# Patient Record
Sex: Male | Born: 1990 | Race: Black or African American | Hispanic: No | Marital: Single | Smoking: Current every day smoker
Health system: Southern US, Community
[De-identification: ages and names within clinical notes are randomized; demographics above are authoritative.]

## PROBLEM LIST (undated history)

## (undated) DIAGNOSIS — R569 Unspecified convulsions: Secondary | ICD-10-CM

---

## 2016-05-29 ENCOUNTER — Emergency Department
Admission: EM | Admit: 2016-05-29 | Discharge: 2016-05-29 | Disposition: A | Discharge status: Home / Self Care | Payer: Self-pay | Attending: Emergency Medicine | Admitting: Emergency Medicine

## 2016-05-29 DIAGNOSIS — R569 Unspecified convulsions: Secondary | ICD-10-CM

## 2016-05-29 DIAGNOSIS — G40909 Epilepsy, unspecified, not intractable, without status epilepticus: Secondary | ICD-10-CM | POA: Insufficient documentation

## 2016-05-29 DIAGNOSIS — F17210 Nicotine dependence, cigarettes, uncomplicated: Secondary | ICD-10-CM | POA: Insufficient documentation

## 2016-05-29 HISTORY — DX: Unspecified convulsions: R56.9

## 2016-05-29 LAB — BASIC METABOLIC PANEL
Anion gap: 7 (ref 5–15)
BUN: 12 mg/dL (ref 6–20)
CALCIUM: 9.3 mg/dL (ref 8.9–10.3)
CO2: 28 mmol/L (ref 22–32)
CREATININE: 1.15 mg/dL (ref 0.61–1.24)
Chloride: 101 mmol/L (ref 101–111)
GFR calc Af Amer: >60 mL/min (ref >60)
GLUCOSE: 146 mg/dL — AB (ref 65–99)
Potassium: 3.3 mmol/L — ABNORMAL LOW (ref 3.5–5.1)
Sodium: 136 mmol/L (ref 135–145)

## 2016-05-29 LAB — CBC
HEMATOCRIT: 40.1 % (ref 40.0–52.0)
Hemoglobin: 13.4 g/dL (ref 13.0–18.0)
MCH: 29.5 pg (ref 26.0–34.0)
MCHC: 33.5 g/dL (ref 32.0–36.0)
MCV: 88 fL (ref 80.0–100.0)
PLATELETS: 242 10*3/uL (ref 150–440)
RBC: 4.56 MIL/uL (ref 4.40–5.90)
RDW: 13 % (ref 11.5–14.5)
WBC: 7.6 10*3/uL (ref 3.8–10.6)

## 2016-05-29 MED ORDER — DIVALPROEX SODIUM ER 500 MG PO TB24: 1000.0000 mg | ORAL_TABLET | Freq: Every day | ORAL | 1 refills | Status: AC

## 2016-05-29 NOTE — ED Provider Notes (Signed)
[facility] Emergency Department Provider Note   ____________________________________________   I have reviewed the triage vital signs and the nursing notes.   HISTORY  Chief Complaint Seizures   History limited by: Not Limited   HPI Brett Thompson is a 26 y.o. male who presents to the emergency department today because of concerns for possible seizure-like activity. The patient states he was at an ice cream shop when he started to feel very warm. He started feeling lightheaded. The next thing he remembers is being transported to the hospital. Apparently had seizure-like activity. The patient states that he does have a headache. He states that he feels like he normally does after a seizure. The patient states that he does have a history of seizures. He has not been on  medication for at least a year. He denies that a doctor told him to stop. Patient denies any recent fevers or illness. He denies any sleep deprivation. Denies any alcohol or stimulant drug use.   Past Medical History:  Diagnosis Date  . Seizures (HCC)     There are no active problems to display for this patient.   History reviewed. No pertinent surgical history.  Prior to Admission medications   Not on File    Allergies Red dye  No family history on file.  Social History Social History  Substance Use Topics  . Smoking status: Current Every Day Smoker    Packs/day: 0.50    Types: Cigarettes  . Smokeless tobacco: Never Used  . Alcohol use Yes     Comment: social drinker    Review of Systems Constitutional: No fever/chills Eyes: No visual changes. ENT: No sore throat. Cardiovascular: Denies chest pain. Respiratory: Denies shortness of breath. Gastrointestinal: No abdominal pain.  No nausea, no vomiting.  No diarrhea.   Genitourinary: Negative for dysuria. Musculoskeletal: Negative for back pain. Skin: Negative for rash. Neurological: Negative for headaches, focal weakness or  numbness.  ____________________________________________   PHYSICAL EXAM:  VITAL SIGNS: ED Triage Vitals  Enc Vitals Group     BP 05/29/16 1601 98/62     Pulse Rate 05/29/16 1601 (!) 58     Resp 05/29/16 1601 16     Temp 05/29/16 1601 97.4 F (36.3 C)     Temp Source 05/29/16 1601 Oral     SpO2 05/29/16 1555 98 %     Weight 05/29/16 1558 190 lb (86.2 kg)     Height 05/29/16 1558  (1.905 m)     Head Circumference --      Peak Flow --      Pain Score 05/29/16 1557 5     Pain Loc --      Pain Edu? --      Excl. in GC? --      Constitutional: Alert and oriented. Somewhat slow with responses.  Eyes: Conjunctivae are normal. Normal extraocular movements. ENT   Head: Normocephalic and atraumatic.   Nose: No congestion/rhinnorhea.   Mouth/Throat: Mucous membranes are moist.   Neck: No stridor. Hematological/Lymphatic/Immunilogical: No cervical lymphadenopathy. Cardiovascular: Normal rate, regular rhythm.  No murmurs, rubs, or gallops.  Respiratory: Normal respiratory effort without tachypnea nor retractions. Breath sounds are clear and equal bilaterally. No wheezes/rales/rhonchi. Gastrointestinal: Soft and non tender. No rebound. No guarding.  Genitourinary: Deferred Musculoskeletal: Normal range of motion in all extremities. No lower extremity edema. Neurologic:  Somewhat slow with responses. EOMI. PERRL. Strength 5/5 in upper and lower extremities. Sensation grossly intact. Skin:  Skin is warm,  dry and intact. No rash noted. Psychiatric: Mood and affect are normal. Speech and behavior are normal. Patient exhibits appropriate insight and judgment.  ____________________________________________    LABS (pertinent positives/negatives)  Labs Reviewed  BASIC METABOLIC PANEL - Abnormal; Notable for the following:       Result Value   Potassium 3.3 (*)    Glucose, Bld 146 (*)    All other components within normal limits  CBC      ____________________________________________   EKG  I, Phineas Semen, attending physician, personally viewed and interpreted this EKG  EKG Time: 1607 Rate: 59 Rhythm: normal sinus rhythm Axis: normal Intervals: qtc 419 QRS: borderline IVCD ST changes: no st elevation, t wave inversion V1 Impression: abnormal ekg   ____________________________________________    RADIOLOGY  None  ____________________________________________   PROCEDURES  Procedures  ____________________________________________   INITIAL IMPRESSION / ASSESSMENT AND PLAN / ED COURSE  Pertinent labs & imaging results that were available during my care of the patient were reviewed by me and considered in my medical decision making (see chart for details).  Patient presents to the emergency department today after seizure-like episode. Patient was somewhat slow to respond on initial exam however repeat exam does show that the patient was much more mentally alert. Do think likely patient had another seizure. He had been followed for seizures in the past and we will restart patient on a low dose of Depakote. Additionally will give patient a neurology follow-up for medication management. I did discuss with the patient seizure precautions including but not limited to no driving.  ____________________________________________   FINAL CLINICAL IMPRESSION(S) / ED DIAGNOSES  Final diagnoses:  Seizure [facility])     Note: This dictation was prepared with Dragon dictation. Any transcriptional errors that result from this process are unintentional     Phineas Semen, MD 05/29/16 2243

## 2016-05-29 NOTE — ED Notes (Signed)
Pt arrived via ems for c/o seizure activity - pt has history of seizures with last one being 2 years ago - pt has not taken depakote in over 2 years for seizures - pt was not incontinent, no N/V, and is A&O x4

## 2016-05-29 NOTE — ED Triage Notes (Signed)
Pt arrived via ems for c/o seizure activity - pt has history of seizures with last one being 2 years ago - pt has not taken depakote in over 2 years for seizures - pt was not incontinent, no N/V, and is A&O x4 

## 2016-05-29 NOTE — Discharge Instructions (Signed)
Please seek medical attention for any high fevers, chest pain, shortness of breath, change in behavior, persistent vomiting, bloody stool or any other new or concerning symptoms. As we discussed it is very important that you do not drive, go up on roofs, swim, or put yourself in any situation that might be dangerous for you or others if you were to have another seizure until you are cleared by a neurologist.

## 2016-07-17 ENCOUNTER — Emergency Department
Admission: EM | Admit: 2016-07-17 | Discharge: 2016-07-17 | Disposition: A | Discharge status: Home / Self Care | Payer: Self-pay | Attending: Emergency Medicine | Admitting: Emergency Medicine

## 2016-07-17 DIAGNOSIS — K047 Periapical abscess without sinus: Secondary | ICD-10-CM | POA: Insufficient documentation

## 2016-07-17 DIAGNOSIS — F17210 Nicotine dependence, cigarettes, uncomplicated: Secondary | ICD-10-CM | POA: Insufficient documentation

## 2016-07-17 DIAGNOSIS — Z79899 Other long term (current) drug therapy: Secondary | ICD-10-CM | POA: Insufficient documentation

## 2016-07-17 MED ORDER — OXYCODONE HCL 5 MG PO TABS
5.0000 mg | ORAL_TABLET | Freq: Once | ORAL | Status: AC
  Administered 2016-07-17: 5 mg via ORAL
  Filled 2016-07-17: qty 1

## 2016-07-17 MED ORDER — PENICILLIN V POTASSIUM 500 MG PO TABS: 500.0000 mg | ORAL_TABLET | Freq: Three times a day (TID) | ORAL | 0 refills | Status: AC

## 2016-07-17 MED ORDER — OXYCODONE HCL 5 MG PO TABS: 5.0000 mg | ORAL_TABLET | ORAL | 0 refills | Status: AC | PRN

## 2016-07-17 NOTE — ED Triage Notes (Signed)
PT c/o right sided toothache for the past week.

## 2016-07-17 NOTE — ED Notes (Signed)
Patient is complaining of right sided dental pain.

## 2016-07-17 NOTE — Discharge Instructions (Signed)
OPTIONS FOR DENTAL FOLLOW UP CARE ° °[place] [facility] [employer] and Human Services - Local Safety Net Dental Clinics °http://www.ncdhhs.gov/dph/oralhealth/services/safetynetclinics.htm °  °[facility] (336-562-3123) ° °[facility] [place] (919-933-9087) ° °[facility] ([phone] ext [phone]) ° °[facility] ([phone]) ° °[facility] ([phone]) °This clinic caters to the indigent population and is on a lottery system. °Location: °[facility], [address], [place] °Clinic Hours: °Wednesdays from 6pm - 9pm, patients seen by a lottery system. °For dates, call or go to [email or web address]/[facility]/patients/[email or web address]-[facility] °Services: °Cleanings, fillings and simple extractions. °Payment Options: °DENTAL WORK IS FREE OF CHARGE. Bring proof of income or support. °Best way to get seen: °Arrive at 5:15 pm - this is a lottery, NOT first come/first serve, so arriving earlier will not increase your chances of being seen. °  °  °[facility] °[phone] °Select option 1 for emergencies °  °Location: °[facility], [address], [place] °Clinic Hours: °No walk-ins accepted - call the day before to schedule an appointment. °Check in times are 9:30 am and 1:30 pm. °Services: °Simple extractions, temporary fillings, pulpectomy/pulp debridement, uncomplicated abscess drainage. °Payment Options: °PAYMENT IS DUE AT THE TIME OF SERVICE.  Fee is usually $100-200, additional surgical procedures (e.g. abscess drainage) may be extra. °Cash, checks, Visa/MasterCard accepted.  Can file Medicaid if patient is covered for dental - patient should call case worker to check. °No discount for [facility] patients. °Best way to get seen: °MUST call the day before and get onto the schedule. Can usually be seen the next 1-2 days. No walk-ins accepted. °  °  °[facility] °[phone] °   °Location: °[facility], [address], [place] °Clinic Hours: °M, W, Th, F 8am or 1:30pm, Tues 9a or 1:30 - first come/first served. °Services: °Simple extractions, temporary fillings, uncomplicated abscess drainage.  You do not need to be an [place] resident. °Payment Options: °PAYMENT IS DUE AT THE TIME OF SERVICE. °Dental insurance, otherwise sliding scale - bring proof of income or support. °Depending on income and treatment needed, cost is usually $50-200. °Best way to get seen: °Arrive early as it is first come/first served. °  °  °[facility] °[phone] °  °Location: °[address] °Clinic Hours: °Mon-Thu 8a-5p °Services: °Most basic dental services including extractions and fillings. °Payment Options: °PAYMENT IS DUE AT THE TIME OF SERVICE. °Sliding scale, up to 50% off - bring proof if income or support. °Medicaid with dental option accepted. °Best way to get seen: °Call to schedule an appointment, can usually be seen within 2 weeks OR they will try to see walk-ins - show up at 8a or 2p (you may have to wait). °  °  °[facility] °[phone] °[place] RESIDENTS ONLY °  °Location: °[facility], [address], [place], [place] [address] °Clinic Hours: By appointment only. °Monday - Thursday 8am-5pm, Friday 8am-12pm °Services: Cleanings, fillings, extractions. °Payment Options: °PAYMENT IS DUE AT THE TIME OF SERVICE. °Cash, Visa or MasterCard. Sliding scale - $30 minimum per service. °Best way to get seen: °Come in to office, complete packet and make an appointment - need proof of income °or support monies for each household member and proof of [place] residence. °Usually takes about a month to get in. °  °  °[facility] °[phone] °  °Location: °[address]   [place] °Clinic Hours: Walk-in Urgent Care Dental Services are offered Monday-Friday  mornings only. °The numbers of emergencies accepted daily is limited to the number of °providers available. °Maximum 15 - Mondays, Wednesdays & Thursdays °Maximum 10 - Tuesdays & Fridays °Services: °You do not need to be a [place] resident to be seen for a dental emergency. °Emergencies are defined as pain, swelling, abnormal bleeding, or dental trauma. Walkins will receive x-rays if needed. °NOTE: Dental cleaning is not an emergency. °Payment Options: °PAYMENT IS DUE AT THE TIME OF SERVICE. °Minimum co-pay is $40.00 for uninsured patients. °Minimum co-pay is $3.00 for Medicaid with dental coverage. °Dental Insurance is accepted and must be presented at time of visit. °Medicare does not cover dental. °Forms of payment: Cash, credit card, checks. °Best way to get seen: °If not previously registered with the clinic, walk-in dental registration begins at 7:15 am and is on a first come/first serve basis. °If previously registered with the clinic, call to make an appointment. °  °  °[facility] °[phone] °[place] RESIDENTS ONLY °  °Location: °[address], [place] °Clinic Hours: °Mon-Thu 10a-2p °Services: Extractions only! °Payment Options: °FREE (donations accepted) - bring proof of income or support °Best way to get seen: °Call and schedule an appointment OR come at 8am on the 1st Monday of every month (except for holidays) when it is first come/first served. °  °  °[facility] °[phone] °  °Location: °[address], [place] °Clinic Hours: °Friday mornings °Services, Payment Options, Best way to get seen: °Call for info °

## 2016-07-17 NOTE — ED Provider Notes (Signed)
[facility] Emergency Department Provider Note  ____________________________________________  Time seen: Approximately 7:10 PM  I have reviewed the triage vital signs and the nursing notes.   HISTORY  Chief Complaint Dental Pain    HPI Brett Thompson is a 26 y.o. male that presents to the emergency department with upper and lower right-sided dental pain for one week. Patient rates from the right side of his mouth to his ear. He characterizes the pain as the pain as throbbing. He had chills last night. Patient states that he has taken a significant amount of Tylenol this week. He last saw the dentist one year ago. He knows he needs to have his molars removed but was "800 hours for the procedure. He denies drainage from mouth, difficulty opening and closing mouth, shortness of breath, chest pain, nausea, vomiting, abdominal pain.   Past Medical History:  Diagnosis Date  . Seizures (HCC)     There are no active problems to display for this patient.   History reviewed. No pertinent surgical history.  Prior to Admission medications   Medication Sig Start Date End Date Taking? Authorizing Provider  divalproex (DEPAKOTE ER) 500 MG 24 hr tablet Take 2 tablets (1,000 mg total) by mouth daily. 05/29/16 05/29/17  Phineas SemenGoodman, Graydon, MD  oxyCODONE (ROXICODONE) 5 MG immediate release tablet Take 1 tablet (5 mg total) by mouth every 4 (four) hours as needed for severe pain. 07/17/16   Enid DerryWagner, Demontre Padin, PA-C  penicillin v potassium (VEETID) 500 MG tablet Take 1 tablet (500 mg total) by mouth 3 (three) times daily. 07/17/16 07/27/16  Enid DerryWagner, Beecher Furio, PA-C    Allergies Red dye  No family history on file.  Social History Social History  Substance Use Topics  . Smoking status: Current Every Day Smoker    Packs/day: 0.50    Types: Cigarettes  . Smokeless tobacco: Never Used  . Alcohol use Yes     Comment: social drinker     Review of Systems  Cardiovascular: No chest  pain. Respiratory:  No SOB. Gastrointestinal: No abdominal pain.  No nausea, no vomiting.  Musculoskeletal: Negative for musculoskeletal pain. Skin: Negative for rash, abrasions, lacerations, ecchymosis. Neurological: Negative for headaches, numbness or tingling   ____________________________________________   PHYSICAL EXAM:  VITAL SIGNS: ED Triage Vitals  Enc Vitals Group     BP 07/17/16 1815 (!) 175/100     Pulse Rate 07/17/16 1814 (!) 57     Resp 07/17/16 1814 16     Temp 07/17/16 1814 98.9 F (37.2 C)     Temp Source 07/17/16 1814 Oral     SpO2 07/17/16 1814 100 %     Weight 07/17/16 1814 175 lb (79.4 kg)     Height 07/17/16 1814 6\' 3"  (1.905 m)     Head Circumference --      Peak Flow --      Pain Score 07/17/16 1813 9     Pain Loc --      Pain Edu? --      Excl. in GC? --      Constitutional: Alert and oriented. Well appearing and in no acute distress. Eyes: Conjunctivae are normal. PERRL. EOMI. Head: Atraumatic. ENT:      Ears:      Nose: No congestion/rhinnorhea.      Mouth/Throat: Mucous membranes are moist. Large hole with significant decay in upper right molar. Decay present in the bottom right molar. Tenderness to palpation around upper and lower right molars. No TMJ pain.  No drainage. No visible swelling. Neck: No stridor.   Cardiovascular: Normal rate, regular rhythm.  Good peripheral circulation. Respiratory: Normal respiratory effort without tachypnea or retractions. Lungs CTAB. Good air entry to the bases with no decreased or absent breath sounds. Musculoskeletal: Full range of motion to all extremities. No gross deformities appreciated. Neurologic:  Normal speech and language. No gross focal neurologic deficits are appreciated.  Skin:  Skin is warm, dry and intact. No rash noted.   ____________________________________________   LABS (all labs ordered are listed, but only abnormal results are displayed)  Labs Reviewed - No data to  display ____________________________________________  EKG   ____________________________________________  RADIOLOGY  No results found.  ____________________________________________    PROCEDURES  Procedure(s) performed:    Procedures    Medications  oxyCODONE (Oxy IR/ROXICODONE) immediate release tablet 5 mg (5 mg Oral Given 07/17/16 1900)     ____________________________________________   INITIAL IMPRESSION / ASSESSMENT AND PLAN / ED COURSE  Pertinent labs & imaging results that were available during my care of the patient were reviewed by me and considered in my medical decision making (see chart for details).  Review of the [place] CSRS was performed in accordance of the [facility] prior to dispensing any controlled drugs.     Patient's diagnosis is consistent with dental abscess. Vital signs and exam are reassuring. Education was provided. Patient is allergic to red dye and cannot take clindamycin or amoxicillin. Patient will be discharged home with prescriptions for a couple of doses of oxycodone and penicillin. Patient is to follow up with a dentist as directed. Patient is given ED precautions to return to the ED for any worsening or new symptoms.     ____________________________________________  FINAL CLINICAL IMPRESSION(S) / ED DIAGNOSES  Final diagnoses:  Dental abscess      NEW MEDICATIONS STARTED DURING THIS VISIT:  Discharge Medication List as of 07/17/2016  7:33 PM    START taking these medications   Details  oxyCODONE (ROXICODONE) 5 MG immediate release tablet Take 1 tablet (5 mg total) by mouth every 4 (four) hours as needed for severe pain., Starting Tue 07/17/2016, Print    penicillin v potassium (VEETID) 500 MG tablet Take 1 tablet (500 mg total) by mouth 3 (three) times daily., Starting Tue 07/17/2016, Until Fri 07/27/2016, Print            This chart was dictated using voice recognition software/Dragon. Despite best efforts to proofread,  errors can occur which can change the meaning. Any change was purely unintentional.    Enid Derry, PA-C 07/17/16 2022    Pershing Proud Myra Rude, MD 07/17/16 [phone]

## 2018-02-16 ENCOUNTER — Other Ambulatory Visit: Payer: Self-pay

## 2018-02-16 ENCOUNTER — Emergency Department
Admission: EM | Admit: 2018-02-16 | Discharge: 2018-02-16 | Disposition: A | Discharge status: Home / Self Care | Payer: Self-pay | Attending: Emergency Medicine | Admitting: Emergency Medicine

## 2018-02-16 ENCOUNTER — Emergency Department: Payer: Self-pay

## 2018-02-16 DIAGNOSIS — R05 Cough: Secondary | ICD-10-CM | POA: Insufficient documentation

## 2018-02-16 DIAGNOSIS — R059 Cough, unspecified: Secondary | ICD-10-CM

## 2018-02-16 DIAGNOSIS — F17210 Nicotine dependence, cigarettes, uncomplicated: Secondary | ICD-10-CM | POA: Insufficient documentation

## 2018-02-16 DIAGNOSIS — Z77120 Contact with and (suspected) exposure to mold (toxic): Secondary | ICD-10-CM | POA: Insufficient documentation

## 2018-02-16 LAB — INFLUENZA PANEL BY PCR (TYPE A & B)
INFLBPCR: NEGATIVE
Influenza A By PCR: NEGATIVE

## 2018-02-16 MED ORDER — ALBUTEROL SULFATE HFA 108 (90 BASE) MCG/ACT IN AERS
2.0000 | INHALATION_SPRAY | Freq: Four times a day (QID) | RESPIRATORY_TRACT | 0 refills | Status: AC | PRN
Start: 2018-02-16 — End: ?

## 2018-02-16 MED ORDER — IPRATROPIUM-ALBUTEROL 0.5-2.5 (3) MG/3ML IN SOLN
3.0000 mL | Freq: Once | RESPIRATORY_TRACT | Status: AC
  Administered 2018-02-16: 3 mL via RESPIRATORY_TRACT
  Filled 2018-02-16: qty 3

## 2018-02-16 MED ORDER — CETIRIZINE HCL 10 MG PO CAPS: 10.0000 mg | ORAL_CAPSULE | Freq: Every day | ORAL | 0 refills | Status: AC

## 2018-02-16 NOTE — ED Notes (Signed)
esignature pad not working in room. Pt verbalized understanding of discharge and follow up instructions 

## 2018-02-16 NOTE — Discharge Instructions (Addendum)
Your flu test is negative.  Your chest x-ray is negative for pneumonia.  Please call the health department to discuss options for mold exposure exposure.  Please begin allergy medication and use inhaler as needed.

## 2018-02-16 NOTE — ED Provider Notes (Signed)
[facility] Emergency Department Provider Note  ____________________________________________  Time seen: Approximately 11:51 AM  I have reviewed the triage vital signs and the nursing notes.   HISTORY  Chief Complaint Allergic Reaction    HPI Brett Thompson is a 28 y.o. male that presents emergency department for evaluation of nonproductive cough on and off for 1 month. Patient has occasional shortness of breath. Patient states that family just found out that there is mold in their house.  They have talked to the landlord, who they say will not do anything about the mold.  He here with his family that have similar symptoms.  He smokes a half a pack of cigarettes per day.  No asthma or allergies.  No fever, chest pain, nausea, vomiting, abdominal pain.   Past Medical History:  Diagnosis Date  . Seizures (HCC)     There are no active problems to display for this patient.   History reviewed. No pertinent surgical history.  Prior to Admission medications   Medication Sig Start Date End Date Taking? Authorizing Provider  albuterol (PROVENTIL HFA;VENTOLIN HFA) 108 (90 Base) MCG/ACT inhaler Inhale 2 puffs into the lungs every 6 (six) hours as needed for wheezing or shortness of breath. 02/16/18   Enid Derry, PA-C  Cetirizine HCl (ZYRTEC ALLERGY) 10 MG CAPS Take 1 capsule (10 mg total) by mouth daily. 02/16/18   Enid Derry, PA-C  divalproex (DEPAKOTE ER) 500 MG 24 hr tablet Take 2 tablets (1,000 mg total) by mouth daily. 05/29/16 05/29/17  Phineas Semen, MD  oxyCODONE (ROXICODONE) 5 MG immediate release tablet Take 1 tablet (5 mg total) by mouth every 4 (four) hours as needed for severe pain. 07/17/16   Enid Derry, PA-C    Allergies Red dye  History reviewed. No pertinent family history.  Social History Social History   Tobacco Use  . Smoking status: Current Every Day Smoker    Packs/day: 0.50    Types: Cigarettes  . Smokeless tobacco: Never Used   Substance Use Topics  . Alcohol use: Yes    Comment: social drinker  . Drug use: No     Review of Systems  Constitutional: No fever/chills Eyes: No visual changes. No discharge. ENT: Positive for congestion and rhinorrhea. Cardiovascular: No chest pain. Respiratory: Positive for cough and SOB. Gastrointestinal: No abdominal pain.  No nausea, no vomiting.   Musculoskeletal: Negative for musculoskeletal pain. Skin: Negative for rash, abrasions, lacerations, ecchymosis. Neurological: Negative for headaches.   ____________________________________________   PHYSICAL EXAM:  VITAL SIGNS: ED Triage Vitals [02/16/18 1102]  Enc Vitals Group     BP (!) 157/82     Pulse Rate 69     Resp 16     Temp 98.3 F (36.8 C)     Temp Source Oral     SpO2 99 %     Weight 175 lb (79.4 kg)     Height 6\' 3"  (1.905 m)     Head Circumference      Peak Flow      Pain Score 5     Pain Loc      Pain Edu?      Excl. in GC?      Constitutional: Alert and oriented. Well appearing and in no acute distress. Eyes: Conjunctivae are normal. PERRL. EOMI. No discharge. Head: Atraumatic. ENT: No frontal and maxillary sinus tenderness.      Ears: Tympanic membranes pearly gray with good landmarks. No discharge.      Nose: No  congestion/rhinnorhea.      Mouth/Throat: Mucous membranes are moist. Oropharynx non-erythematous.  Neck: No stridor.   Hematological/Lymphatic/Immunilogical: No cervical lymphadenopathy. Cardiovascular: Normal rate, regular rhythm.  Good peripheral circulation. Respiratory: Normal respiratory effort without tachypnea or retractions. Lungs CTAB. Good air entry to the bases with no decreased or absent breath sounds. Gastrointestinal: Bowel sounds 4 quadrants. Soft and nontender to palpation. No guarding or rigidity. No palpable masses. No distention. Musculoskeletal: Full range of motion to all extremities. No gross deformities appreciated. Neurologic:  Normal speech and  language. No gross focal neurologic deficits are appreciated.  Skin:  Skin is warm, dry and intact. No rash noted. Psychiatric: Mood and affect are normal. Speech and behavior are normal. Patient exhibits appropriate insight and judgement.   ____________________________________________   LABS (all labs ordered are listed, but only abnormal results are displayed)  Labs Reviewed  INFLUENZA PANEL BY PCR (TYPE A & B)   ____________________________________________  EKG   ____________________________________________  RADIOLOGY Lexine Baton, personally viewed and evaluated these images (plain radiographs) as part of my medical decision making, as well as reviewing the written report by the radiologist.  Dg Chest 2 View  Result Date: 02/16/2018 CLINICAL DATA:  Cough and shortness of breath EXAM: CHEST - 2 VIEW COMPARISON:  None. FINDINGS: Lungs are clear. Heart size and pulmonary vascularity are normal. No adenopathy. No bone lesions. IMPRESSION: No edema or consolidation. Electronically Signed   By: Bretta Bang III M.D.   On: 02/16/2018 12:28    ____________________________________________    PROCEDURES  Procedure(s) performed:    Procedures    Medications  ipratropium-albuterol (DUONEB) 0.5-2.5 (3) MG/3ML nebulizer solution 3 mL (3 mLs Nebulization Given 02/16/18 1213)     ____________________________________________   INITIAL IMPRESSION / ASSESSMENT AND PLAN / ED COURSE  Pertinent labs & imaging results that were available during my care of the patient were reviewed by me and considered in my medical decision making (see chart for details).  Review of the [place] CSRS was performed in accordance of the [facility] prior to dispensing any controlled drugs.     Patient presented the emergency department with family for concerns of exposure to mold. Vital signs and exam are reassuring.  Chest x-ray negative for acute cardiopulmonary processes.  Influenza test is  negative.  Patient felt much better after DuoNeb.   Patient feels comfortable going home. Patient will be discharged home with prescriptions for albuterol inhaler and Zyrtec. Patient is to follow up with primary care and health department as needed or otherwise directed. Patient is given ED precautions to return to the ED for any worsening or new symptoms.     ____________________________________________  FINAL CLINICAL IMPRESSION(S) / ED DIAGNOSES  Final diagnoses:  Mold exposure  Cough      NEW MEDICATIONS STARTED DURING THIS VISIT:  ED Discharge Orders         Ordered    albuterol (PROVENTIL HFA;VENTOLIN HFA) 108 (90 Base) MCG/ACT inhaler  Every 6 hours PRN     02/16/18 1356    Cetirizine HCl (ZYRTEC ALLERGY) 10 MG CAPS  Daily     02/16/18 1356              This chart was dictated using voice recognition software/Dragon. Despite best efforts to proofread, errors can occur which can change the meaning. Any change was purely unintentional.    Enid Derry, PA-C 02/16/18 1532    Sharyn Creamer, MD 02/16/18 [phone]

## 2018-02-16 NOTE — ED Triage Notes (Signed)
Here with family. Exposed to mold. A&O, no distress noted. Ambulatory.

## 2018-04-27 ENCOUNTER — Other Ambulatory Visit: Payer: Self-pay

## 2018-04-27 ENCOUNTER — Emergency Department
Admission: EM | Admit: 2018-04-27 | Discharge: 2018-04-27 | Disposition: A | Discharge status: Home / Self Care | Payer: Self-pay | Attending: Emergency Medicine | Admitting: Emergency Medicine

## 2018-04-27 ENCOUNTER — Encounter: Payer: Self-pay | Admitting: Emergency Medicine

## 2018-04-27 DIAGNOSIS — Z79899 Other long term (current) drug therapy: Secondary | ICD-10-CM | POA: Insufficient documentation

## 2018-04-27 DIAGNOSIS — B9789 Other viral agents as the cause of diseases classified elsewhere: Secondary | ICD-10-CM | POA: Insufficient documentation

## 2018-04-27 DIAGNOSIS — J069 Acute upper respiratory infection, unspecified: Secondary | ICD-10-CM | POA: Insufficient documentation

## 2018-04-27 DIAGNOSIS — F17210 Nicotine dependence, cigarettes, uncomplicated: Secondary | ICD-10-CM | POA: Insufficient documentation

## 2018-04-27 NOTE — ED Triage Notes (Signed)
Pt to ER with c/o headaches, runny nose and feeling "sluggish" for last several days.

## 2018-04-27 NOTE — ED Provider Notes (Signed)
[facility] Emergency Department Provider Note   ____________________________________________    I have reviewed the triage vital signs and the nursing notes.   HISTORY  Chief Complaint URI     HPI Brett Thompson is a 28 y.o. male who presents with complaints of mild cough, fatigue x1 to 2 days.  He reports his whole family has similar complaints, he was here with his son who is also being evaluated.  He denies shortness of breath.  No recent travel.  No exposure to anyone with a novel coronavirus that he knows of.  Denies shortness of breath.  Past Medical History:  Diagnosis Date  . Seizures (HCC)     There are no active problems to display for this patient.   History reviewed. No pertinent surgical history.  Prior to Admission medications   Medication Sig Start Date End Date Taking? Authorizing Provider  albuterol (PROVENTIL HFA;VENTOLIN HFA) 108 (90 Base) MCG/ACT inhaler Inhale 2 puffs into the lungs every 6 (six) hours as needed for wheezing or shortness of breath. 02/16/18   Enid Derry, PA-C  Cetirizine HCl (ZYRTEC ALLERGY) 10 MG CAPS Take 1 capsule (10 mg total) by mouth daily. 02/16/18   Enid Derry, PA-C  divalproex (DEPAKOTE ER) 500 MG 24 hr tablet Take 2 tablets (1,000 mg total) by mouth daily. 05/29/16 05/29/17  Phineas Semen, MD  oxyCODONE (ROXICODONE) 5 MG immediate release tablet Take 1 tablet (5 mg total) by mouth every 4 (four) hours as needed for severe pain. 07/17/16   Enid Derry, PA-C     Allergies Ibuprofen and Red dye  History reviewed. No pertinent family history.  Social History Social History   Tobacco Use  . Smoking status: Current Every Day Smoker    Packs/day: 0.50    Types: Cigarettes  . Smokeless tobacco: Never Used  Substance Use Topics  . Alcohol use: Yes    Comment: social drinker  . Drug use: No    Review of Systems  Constitutional: No fever/chills, positive fatigue  ENT: Minimal sore  throat  Respiratory: No shortness of breath Gastrointestinal: No abdominal pain.    Musculoskeletal: No myalgias Skin: Negative for rash. Neurological: Negative for headaches     ____________________________________________   PHYSICAL EXAM:  VITAL SIGNS: ED Triage Vitals  Enc Vitals Group     BP 04/27/18 2214 133/83     Pulse Rate 04/27/18 2214 82     Resp 04/27/18 2214 18     Temp 04/27/18 2214 99.1 F (37.3 C)     Temp src --      SpO2 04/27/18 2214 99 %     Weight 04/27/18 2217 77.1 kg (170 lb)     Height 04/27/18 2217 1.905 m (6\' 3" )     Head Circumference --      Peak Flow --      Pain Score 04/27/18 2216 7     Pain Loc --      Pain Edu? --      Excl. in GC? --      Constitutional: Alert and oriented. No acute distress. Pleasant and interactive c. Nose: No congestion/rhinnorhea. Mouth/Throat: Mucous membranes are moist.   Cardiovascular: Normal rate, regular rhythm.  Respiratory: Normal respiratory effort.  No retractions.  Clear to auscultation bilaterally  Musculoskeletal: No lower extremity tenderness nor edema.   Neurologic:  Normal speech and language. No gross focal neurologic deficits are appreciated.   Skin:  Skin is warm, dry and intact. No rash  noted.   ____________________________________________   LABS (all labs ordered are listed, but only abnormal results are displayed)  Labs Reviewed - No data to display ____________________________________________  EKG   ____________________________________________  RADIOLOGY  None ____________________________________________   PROCEDURES  Procedure(s) performed: No  Procedures   Critical Care performed: No ____________________________________________   INITIAL IMPRESSION / ASSESSMENT AND PLAN / ED COURSE  Pertinent labs & imaging results that were available during my care of the patient were reviewed by me and considered in my medical decision making (see chart for details).   Patient well-appearing and in no acute distress.  Exam is quite reassuring, vital signs unremarkable.  Symptoms are consistent with viral upper respiratory infection, recommend supportive care, outpatient follow-up as needed.  Return precautions discussed  Brett Thompson was evaluated in Emergency Department on 04/27/2018 for the symptoms described in the history of present illness. He was evaluated in the context of the global COVID-19 pandemic, which necessitated consideration that the patient might be at risk for infection with the SARS-CoV-2 virus that causes COVID-19. Institutional protocols and algorithms that pertain to the evaluation of patients at risk for COVID-19 are in a state of rapid change based on information released by regulatory bodies including the CDC and federal and state organizations. These policies and algorithms were followed during the patient's care in the ED.    ____________________________________________   FINAL CLINICAL IMPRESSION(S) / ED DIAGNOSES  Final diagnoses:  Viral URI with cough      NEW MEDICATIONS STARTED DURING THIS VISIT:  New Prescriptions   No medications on file     Note:  This document was prepared using Dragon voice recognition software and may include unintentional dictation errors.   Jene Every, MD 04/27/18 [phone]

## 2018-06-28 ENCOUNTER — Other Ambulatory Visit: Payer: Self-pay

## 2018-06-28 ENCOUNTER — Emergency Department: Payer: Self-pay

## 2018-06-28 ENCOUNTER — Encounter: Payer: Self-pay | Admitting: Emergency Medicine

## 2018-06-28 DIAGNOSIS — Y939 Activity, unspecified: Secondary | ICD-10-CM | POA: Insufficient documentation

## 2018-06-28 DIAGNOSIS — Z5321 Procedure and treatment not carried out due to patient leaving prior to being seen by health care provider: Secondary | ICD-10-CM | POA: Insufficient documentation

## 2018-06-28 DIAGNOSIS — Y999 Unspecified external cause status: Secondary | ICD-10-CM | POA: Insufficient documentation

## 2018-06-28 DIAGNOSIS — S6992XA Unspecified injury of left wrist, hand and finger(s), initial encounter: Secondary | ICD-10-CM | POA: Insufficient documentation

## 2018-06-28 DIAGNOSIS — Y929 Unspecified place or not applicable: Secondary | ICD-10-CM | POA: Insufficient documentation

## 2018-06-28 DIAGNOSIS — W231XXA Caught, crushed, jammed, or pinched between stationary objects, initial encounter: Secondary | ICD-10-CM | POA: Insufficient documentation

## 2018-06-28 NOTE — ED Triage Notes (Signed)
Pt hit his left index finger with a socket wrench about 30 mins ago. Pain and swelling to the area. CMS intact.

## 2018-06-29 ENCOUNTER — Emergency Department
Admission: EM | Admit: 2018-06-29 | Discharge: 2018-06-29 | Disposition: A | Discharge status: Home / Self Care | Payer: Self-pay | Attending: Emergency Medicine | Admitting: Emergency Medicine

## 2018-06-29 ENCOUNTER — Encounter: Payer: Self-pay | Admitting: Emergency Medicine

## 2018-06-29 ENCOUNTER — Other Ambulatory Visit: Payer: Self-pay

## 2018-06-29 DIAGNOSIS — Y9389 Activity, other specified: Secondary | ICD-10-CM | POA: Insufficient documentation

## 2018-06-29 DIAGNOSIS — Y929 Unspecified place or not applicable: Secondary | ICD-10-CM | POA: Insufficient documentation

## 2018-06-29 DIAGNOSIS — Z79899 Other long term (current) drug therapy: Secondary | ICD-10-CM | POA: Insufficient documentation

## 2018-06-29 DIAGNOSIS — Y998 Other external cause status: Secondary | ICD-10-CM | POA: Insufficient documentation

## 2018-06-29 DIAGNOSIS — F17210 Nicotine dependence, cigarettes, uncomplicated: Secondary | ICD-10-CM | POA: Insufficient documentation

## 2018-06-29 DIAGNOSIS — W228XXA Striking against or struck by other objects, initial encounter: Secondary | ICD-10-CM | POA: Insufficient documentation

## 2018-06-29 DIAGNOSIS — S60222A Contusion of left hand, initial encounter: Secondary | ICD-10-CM | POA: Insufficient documentation

## 2018-06-29 NOTE — ED Notes (Signed)
Pt with c/o of left 1st finger pain. Pt has limited movement of finger. No swelling and skin is intact.

## 2018-06-29 NOTE — ED Notes (Signed)
Pt verbalized understanding of discharge instructions. NAD at this time. 

## 2018-06-29 NOTE — ED Triage Notes (Signed)
Patient presents to the ED with left forefinger pain since yesterday when he hit his finger with a socket wrench.  Patient was seen in triage yesterday and xrays were performed but patient left prior to provider evaluation.  Patient is in no obvious distress at this time.

## 2018-06-29 NOTE — ED Provider Notes (Signed)
[facility] Emergency Department Provider Note  ____________________________________________   First MD Initiated Contact with Patient 06/29/18 1320     (approximate)  I have reviewed the triage vital signs and the nursing notes.   HISTORY  Chief Complaint Hand Pain    HPI Brett Thompson is a 28 y.o. male presents emergency department complaining of left index finger pain.  He states he hit it with a socket wrench.  He was seen here yesterday and a x-ray was ordered but he left prior to getting results.    Past Medical History:  Diagnosis Date  . Seizures (HCC)     There are no active problems to display for this patient.   History reviewed. No pertinent surgical history.  Prior to Admission medications   Medication Sig Start Date End Date Taking? Authorizing Provider  albuterol (PROVENTIL HFA;VENTOLIN HFA) 108 (90 Base) MCG/ACT inhaler Inhale 2 puffs into the lungs every 6 (six) hours as needed for wheezing or shortness of breath. 02/16/18   Enid Derry, PA-C  Cetirizine HCl (ZYRTEC ALLERGY) 10 MG CAPS Take 1 capsule (10 mg total) by mouth daily. 02/16/18   Enid Derry, PA-C  divalproex (DEPAKOTE ER) 500 MG 24 hr tablet Take 2 tablets (1,000 mg total) by mouth daily. 05/29/16 05/29/17  Phineas Semen, MD  oxyCODONE (ROXICODONE) 5 MG immediate release tablet Take 1 tablet (5 mg total) by mouth every 4 (four) hours as needed for severe pain. 07/17/16   Enid Derry, PA-C    Allergies Ibuprofen and Red dye  No family history on file.  Social History Social History   Tobacco Use  . Smoking status: Current Every Day Smoker    Packs/day: 0.50    Types: Cigarettes  . Smokeless tobacco: Never Used  Substance Use Topics  . Alcohol use: Yes    Comment: social drinker  . Drug use: No    Review of Systems  Constitutional: No fever/chills Eyes: No visual changes. ENT: No sore throat. Respiratory: Denies cough Genitourinary: Negative for  dysuria. Musculoskeletal: Negative for back pain.  Left index finger pain Skin: Negative for rash.    ____________________________________________   PHYSICAL EXAM:  VITAL SIGNS: ED Triage Vitals  Enc Vitals Group     BP 06/29/18 1245 (!) 153/93     Pulse Rate 06/29/18 1245 (!) 56     Resp 06/29/18 1245 18     Temp 06/29/18 1245 98.3 F (36.8 C)     Temp Source 06/29/18 1245 Oral     SpO2 06/29/18 1245 100 %     Weight 06/29/18 1236 175 lb (79.4 kg)     Height 06/29/18 1236 6\' 2"  (1.88 m)     Head Circumference --      Peak Flow --      Pain Score 06/29/18 1236 8     Pain Loc --      Pain Edu? --      Excl. in GC? --     Constitutional: Alert and oriented. Well appearing and in no acute distress. Eyes: Conjunctivae are normal.  Head: Atraumatic. Nose: No congestion/rhinnorhea. Mouth/Throat: Mucous membranes are moist.   Neck:  supple no lymphadenopathy noted Cardiovascular: Normal rate, regular rhythm. Respiratory: Normal respiratory effort.  No retractions GU: deferred Musculoskeletal: FROM all extremities, warm and well perfused, left index finger is tender at the MIP, no swelling is noted, neurovascular is intact Neurologic:  Normal speech and language.  Skin:  Skin is warm, dry and intact. No  rash noted. Psychiatric: Mood and affect are normal. Speech and behavior are normal.  ____________________________________________   LABS (all labs ordered are listed, but only abnormal results are displayed)  Labs Reviewed - No data to display ____________________________________________   ____________________________________________  RADIOLOGY  X-ray from yesterday was reviewed with no fracture noted in the left hand  ____________________________________________   PROCEDURES  Procedure(s) performed: Fingers were buddy taped  Procedures    ____________________________________________   INITIAL IMPRESSION / ASSESSMENT AND PLAN / ED COURSE  Pertinent  labs & imaging results that were available during my care of the patient were reviewed by me and considered in my medical decision making (see chart for details).   Patient is 28 year old male presents emergency department complaining of left index finger pain.  Had x-ray yesterday but left prior to receiving results.  Physical exam left index finger is tender at the MIP.  Minimal swelling is noted.  Neurovascular is intact  X-ray is negative  Fingers were buddy taped  Explained all the findings to the patient.  His x-ray is negative.  He is to follow-up with orthopedics if not better in 5 to 7 days.  States he understands will comply.  Is discharged stable condition.     As part of my medical decision making, I reviewed the following data within the electronic MEDICAL RECORD NUMBER Nursing notes reviewed and incorporated, Old chart reviewed, Radiograph reviewed from 06/28/2018, and is negative for fracture, Notes from prior ED visits and  Controlled Substance Database  ____________________________________________   FINAL CLINICAL IMPRESSION(S) / ED DIAGNOSES  Final diagnoses:  Contusion of left hand, initial encounter      NEW MEDICATIONS STARTED DURING THIS VISIT:  Discharge Medication List as of 06/29/2018  1:47 PM       Note:  This document was prepared using Dragon voice recognition software and may include unintentional dictation errors.    Faythe GheeFisher, Susan W, PA-C 06/29/18 1545    Arnaldo NatalMalinda, Paul F, MD 06/29/18 [phone]

## 2018-06-29 NOTE — Discharge Instructions (Addendum)
Follow-up with your regular doctor or Dr. Rosita Kea if not better in 5 to 7 days.  Keep the fingers buddy taped to give him extra support and calm down the inflammation.  Take Tylenol and ibuprofen for pain as needed.  Apply ice and keep the hand elevated to decrease pain.  Return if worsening.

## 2018-11-28 ENCOUNTER — Emergency Department
Admission: EM | Admit: 2018-11-28 | Discharge: 2018-11-28 | Disposition: A | Discharge status: Home / Self Care | Payer: Self-pay | Attending: Emergency Medicine | Admitting: Emergency Medicine

## 2018-11-28 ENCOUNTER — Other Ambulatory Visit: Payer: Self-pay

## 2018-11-28 DIAGNOSIS — B349 Viral infection, unspecified: Secondary | ICD-10-CM | POA: Insufficient documentation

## 2018-11-28 DIAGNOSIS — F17210 Nicotine dependence, cigarettes, uncomplicated: Secondary | ICD-10-CM | POA: Insufficient documentation

## 2018-11-28 DIAGNOSIS — Z20828 Contact with and (suspected) exposure to other viral communicable diseases: Secondary | ICD-10-CM | POA: Insufficient documentation

## 2018-11-28 DIAGNOSIS — Z79899 Other long term (current) drug therapy: Secondary | ICD-10-CM | POA: Insufficient documentation

## 2018-11-28 LAB — CBC
HCT: 35.6 % — ABNORMAL LOW (ref 39.0–52.0)
Hemoglobin: 12.1 g/dL — ABNORMAL LOW (ref 13.0–17.0)
MCH: 29.5 pg (ref 26.0–34.0)
MCHC: 34 g/dL (ref 30.0–36.0)
MCV: 86.8 fL (ref 80.0–100.0)
Platelets: 216 10*3/uL (ref 150–400)
RBC: 4.1 MIL/uL — ABNORMAL LOW (ref 4.22–5.81)
RDW: 12.1 % (ref 11.5–15.5)
WBC: 8.8 10*3/uL (ref 4.0–10.5)
nRBC: 0 % (ref 0.0–0.2)

## 2018-11-28 LAB — COMPREHENSIVE METABOLIC PANEL
ALT: 34 U/L (ref 0–44)
AST: 30 U/L (ref 15–41)
Albumin: 4.1 g/dL (ref 3.5–5.0)
Alkaline Phosphatase: 60 U/L (ref 38–126)
Anion gap: 10 (ref 5–15)
BUN: 11 mg/dL (ref 6–20)
CO2: 27 mmol/L (ref 22–32)
Calcium: 9.1 mg/dL (ref 8.9–10.3)
Chloride: 105 mmol/L (ref 98–111)
Creatinine, Ser: 0.82 mg/dL (ref 0.61–1.24)
GFR calc Af Amer: >60 mL/min (ref >60)
GFR calc non Af Amer: >60 mL/min (ref >60)
Glucose, Bld: 102 mg/dL — ABNORMAL HIGH (ref 70–99)
Potassium: 3.8 mmol/L (ref 3.5–5.1)
Sodium: 142 mmol/L (ref 135–145)
Total Bilirubin: 0.5 mg/dL (ref 0.3–1.2)
Total Protein: 6.6 g/dL (ref 6.5–8.1)

## 2018-11-28 MED ORDER — METHYLPREDNISOLONE SODIUM SUCC 125 MG IJ SOLR
80.0000 mg | Freq: Once | INTRAMUSCULAR | Status: AC
  Administered 2018-11-28: 80 mg via INTRAVENOUS
  Filled 2018-11-28: qty 2

## 2018-11-28 MED ORDER — DIPHENHYDRAMINE HCL 50 MG/ML IJ SOLN
25.0000 mg | Freq: Once | INTRAMUSCULAR | Status: AC
  Administered 2018-11-28: 25 mg via INTRAVENOUS
  Filled 2018-11-28: qty 1

## 2018-11-28 MED ORDER — ONDANSETRON HCL 4 MG/2ML IJ SOLN
4.0000 mg | Freq: Once | INTRAMUSCULAR | Status: AC
  Administered 2018-11-28: 4 mg via INTRAVENOUS
  Filled 2018-11-28: qty 2

## 2018-11-28 MED ORDER — BENZONATATE 100 MG PO CAPS
100.0000 mg | ORAL_CAPSULE | Freq: Three times a day (TID) | ORAL | 0 refills | Status: AC | PRN
Start: 2018-11-28 — End: 2019-11-28

## 2018-11-28 MED ORDER — SODIUM CHLORIDE 0.9 % IV BOLUS
500.0000 mL | Freq: Once | INTRAVENOUS | Status: AC
  Administered 2018-11-28: 500 mL via INTRAVENOUS

## 2018-11-28 NOTE — ED Notes (Signed)
Pt c/o multiple COVID 19 sxs x 2 days. Pt states headaches, chills and sweats, shortness of breath. Pt c/o cough and nausea. Pt's highest temp @ home was 101 today. Pt took tylenol for fever.

## 2018-11-28 NOTE — ED Triage Notes (Signed)
Patient c/o headache, fever, body aches, malaise X 2 days.

## 2018-11-28 NOTE — ED Provider Notes (Signed)
[facility] Emergency Department Provider Note  ____________________________________________  Time seen: Approximately 8:29 PM  I have reviewed the triage vital signs and the nursing notes.   HISTORY  Chief Complaint Headache and Fever    HPI Brett Thompson is a 28 y.o. male that presents to the emergency department for evaluation of fever, headache, body aches, nasal congestion, cough for 2 days.  Patient states that fever today was 101.  He has had moments today where he is so deep in thought and worried about what is going on that he feels lost. Fever today was 101. He is concerned for COVID-19.  He received all his childhood vaccines.  Patient lives with his wife and 2 daughters.  No shortness of breath, chest pain, vomiting, diarrhea.  Past Medical History:  Diagnosis Date  . Seizures (HCC)     There are no active problems to display for this patient.   History reviewed. No pertinent surgical history.  Prior to Admission medications   Medication Sig Start Date End Date Taking? Authorizing Provider  albuterol (PROVENTIL HFA;VENTOLIN HFA) 108 (90 Base) MCG/ACT inhaler Inhale 2 puffs into the lungs every 6 (six) hours as needed for wheezing or shortness of breath. 02/16/18   Enid DerryWagner, Lashina Milles, PA-C  benzonatate (TESSALON PERLES) 100 MG capsule Take 1 capsule (100 mg total) by mouth 3 (three) times daily as needed. 11/28/18 11/28/19  Enid DerryWagner, Richele Strand, PA-C  Cetirizine HCl (ZYRTEC ALLERGY) 10 MG CAPS Take 1 capsule (10 mg total) by mouth daily. 02/16/18   Enid DerryWagner, Trude Cansler, PA-C  divalproex (DEPAKOTE ER) 500 MG 24 hr tablet Take 2 tablets (1,000 mg total) by mouth daily. 05/29/16 05/29/17  Phineas SemenGoodman, Graydon, MD  oxyCODONE (ROXICODONE) 5 MG immediate release tablet Take 1 tablet (5 mg total) by mouth every 4 (four) hours as needed for severe pain. 07/17/16   Enid DerryWagner, Marshun Duva, PA-C    Allergies Ibuprofen and Red dye  No family history on file.  Social History Social History    Tobacco Use  . Smoking status: Current Every Day Smoker    Packs/day: 0.50    Types: Cigarettes  . Smokeless tobacco: Never Used  Substance Use Topics  . Alcohol use: Yes    Comment: social drinker  . Drug use: No     Review of Systems  Constitutional: Positive for fever. Eyes: No visual changes. No discharge. ENT: Positive for congestion and rhinorrhea. Cardiovascular: No chest pain. Respiratory: Positive for cough. No SOB. Gastrointestinal: No abdominal pain. Positive for nausea.  No vomiting.  No diarrhea.  No constipation. Musculoskeletal: Positive for body aches. Skin: Negative for rash, abrasions, lacerations, ecchymosis. Neurological: Positive for headache.   ____________________________________________   PHYSICAL EXAM:  VITAL SIGNS: ED Triage Vitals  Enc Vitals Group     BP 11/28/18 1935 (!) 148/73     Pulse Rate 11/28/18 1935 (!) 50     Resp 11/28/18 1935 16     Temp 11/28/18 1935 98.6 F (37 C)     Temp src --      SpO2 11/28/18 1935 100 %     Weight 11/28/18 1933 175 lb (79.4 kg)     Height 11/28/18 1933 6\' 3"  (1.905 m)     Head Circumference --      Peak Flow --      Pain Score 11/28/18 1932 6     Pain Loc --      Pain Edu? --      Excl. in GC? --  Constitutional: Alert and oriented. Well appearing and in no acute distress. Eyes: Conjunctivae are normal. PERRL. EOMI. No discharge. Head: Atraumatic. ENT: No frontal and maxillary sinus tenderness.      Ears: Tympanic membranes pearly gray with good landmarks. No discharge.      Nose: Mild congestion/rhinnorhea.      Mouth/Throat: Mucous membranes are moist. Neck: No stridor.  Full range of motion of neck.  Negative Kernig's and Brudzinski's. Hematological/Lymphatic/Immunilogical: No cervical lymphadenopathy. Cardiovascular: Normal rate, regular rhythm.  Good peripheral circulation. Respiratory: Normal respiratory effort without tachypnea or retractions. Lungs CTAB. Good air entry to the  bases with no decreased or absent breath sounds. Gastrointestinal: Bowel sounds 4 quadrants. Soft and nontender to palpation. No guarding or rigidity. No palpable masses. No distention. Musculoskeletal: Full range of motion to all extremities. No gross deformities appreciated. Neurologic:  Normal speech and language. No gross focal neurologic deficits are appreciated.  Skin:  Skin is warm, dry and intact. No rash noted. Psychiatric: Mood and affect are normal. Speech and behavior are normal. Patient exhibits appropriate insight and judgement.   ____________________________________________   LABS (all labs ordered are listed, but only abnormal results are displayed)  Labs Reviewed  CBC - Abnormal; Notable for the following components:      Result Value   RBC 4.10 (*)    Hemoglobin 12.1 (*)    HCT 35.6 (*)    All other components within normal limits  COMPREHENSIVE METABOLIC PANEL - Abnormal; Notable for the following components:   Glucose, Bld 102 (*)    All other components within normal limits  SARS CORONAVIRUS 2 (TAT 6-24 HRS)   ____________________________________________  EKG   ____________________________________________  RADIOLOGY   No results found.  ____________________________________________    PROCEDURES  Procedure(s) performed:    Procedures    Medications  diphenhydrAMINE (BENADRYL) injection 25 mg (25 mg Intravenous Given 11/28/18 2137)  sodium chloride 0.9 % bolus 500 mL (0 mLs Intravenous Stopped 11/28/18 2223)  ondansetron (ZOFRAN) injection 4 mg (4 mg Intravenous Given 11/28/18 2136)  methylPREDNISolone sodium succinate (SOLU-MEDROL) 125 mg/2 mL injection 80 mg (80 mg Intravenous Given 11/28/18 2134)     ____________________________________________   INITIAL IMPRESSION / ASSESSMENT AND PLAN / ED COURSE  Pertinent labs & imaging results that were available during my care of the patient were reviewed by me and considered in my medical  decision making (see chart for details).  Review of the [place] CSRS was performed in accordance of the [place] prior to dispensing any controlled drugs.   Patient's diagnosis is consistent with viral illness. Vital signs and exam are reassuring.  Symptoms are consistent with a viral illness.  CBC and CMP are reassuring.  No increased white blood cell count.  Patient is afebrile.  Headache and body aches improved significantly with fluids, Benadryl, Zofran, Solu-Medrol.  Wife is going to drive patient home.  Covid test is in process.  Patient appears well and is staying well hydrated. Patient feels comfortable going home. Patient will be discharged home with prescriptions for [facility].  He will continue taking Tylenol.  Patient is to follow up with primary care health department as needed or otherwise directed. Patient is given ED precautions to return to the ED for any worsening or new symptoms.   Brett Thompson was evaluated in Emergency Department on 11/28/2018 for the symptoms described in the history of present illness. He was evaluated in the context of the global COVID-19 pandemic, which necessitated consideration that the patient  might be at risk for infection with the SARS-CoV-2 virus that causes COVID-19. Institutional protocols and algorithms that pertain to the evaluation of patients at risk for COVID-19 are in a state of rapid change based on information released by regulatory bodies including the CDC and federal and state organizations. These policies and algorithms were followed during the patient's care in the ED.  ____________________________________________  FINAL CLINICAL IMPRESSION(S) / ED DIAGNOSES  Final diagnoses:  Viral illness      NEW MEDICATIONS STARTED DURING THIS VISIT:  ED Discharge Orders         Ordered    benzonatate (TESSALON PERLES) 100 MG capsule  3 times daily PRN     11/28/18 2252              This chart was dictated using voice recognition  software/Dragon. Despite best efforts to proofread, errors can occur which can change the meaning. Any change was purely unintentional.    Enid Derry, PA-C 11/28/18 2322    Minna Antis, MD 12/03/18 2055

## 2018-11-28 NOTE — Discharge Instructions (Signed)
Please return to the emergency department for worsening symptoms.  Your Covid results will be ready in about 24 hours.

## 2018-11-28 NOTE — ED Notes (Signed)
Not in waiting room

## 2018-11-29 LAB — SARS CORONAVIRUS 2 (TAT 6-24 HRS): SARS Coronavirus 2: NEGATIVE

## 2018-12-03 ENCOUNTER — Encounter: Payer: Self-pay | Admitting: Emergency Medicine

## 2018-12-03 ENCOUNTER — Emergency Department
Admission: EM | Admit: 2018-12-03 | Discharge: 2018-12-03 | Disposition: A | Discharge status: Home / Self Care | Payer: Self-pay | Attending: Student in an Organized Health Care Education/Training Program | Admitting: Student in an Organized Health Care Education/Training Program

## 2018-12-03 ENCOUNTER — Other Ambulatory Visit: Payer: Self-pay

## 2018-12-03 DIAGNOSIS — F17210 Nicotine dependence, cigarettes, uncomplicated: Secondary | ICD-10-CM | POA: Insufficient documentation

## 2018-12-03 DIAGNOSIS — J111 Influenza due to unidentified influenza virus with other respiratory manifestations: Secondary | ICD-10-CM | POA: Insufficient documentation

## 2018-12-03 DIAGNOSIS — Z79899 Other long term (current) drug therapy: Secondary | ICD-10-CM | POA: Insufficient documentation

## 2018-12-03 LAB — INFLUENZA PANEL BY PCR (TYPE A & B)
Influenza A By PCR: NEGATIVE
Influenza B By PCR: NEGATIVE

## 2018-12-03 NOTE — ED Provider Notes (Signed)
[name] Emergency Department Provider Note   ____________________________________________   First MD Initiated Contact with Patient 12/03/18 1437     (approximate)  I have reviewed the triage vital signs and the nursing notes.   HISTORY  Chief Complaint Generalized Body Aches    HPI Brett Thompson is a 28 y.o. male patient presents with continue body aches, nausea, diarrhea, and cough.  Patient was seen 11/28/2018 for same complaint he was tested for Covid 19.  Patient state received results which were negative.  Patient stated complaint has persist.  Patient rates his pain discomfort a 5/10.  Patient described his pain as "achy".  Patient is taking Tessalon Perles for cough.         Past Medical History:  Diagnosis Date  . Seizures (HCC)     There are no active problems to display for this patient.   History reviewed. No pertinent surgical history.  Prior to Admission medications   Medication Sig Start Date End Date Taking? Authorizing Provider  albuterol (PROVENTIL HFA;VENTOLIN HFA) 108 (90 Base) MCG/ACT inhaler Inhale 2 puffs into the lungs every 6 (six) hours as needed for wheezing or shortness of breath. 02/16/18   Enid Derry, PA-C  benzonatate (TESSALON PERLES) 100 MG capsule Take 1 capsule (100 mg total) by mouth 3 (three) times daily as needed. 11/28/18 11/28/19  Enid Derry, PA-C  Cetirizine HCl (ZYRTEC ALLERGY) 10 MG CAPS Take 1 capsule (10 mg total) by mouth daily. 02/16/18   Enid Derry, PA-C  divalproex (DEPAKOTE ER) 500 MG 24 hr tablet Take 2 tablets (1,000 mg total) by mouth daily. 05/29/16 05/29/17  Phineas Semen, MD  oxyCODONE (ROXICODONE) 5 MG immediate release tablet Take 1 tablet (5 mg total) by mouth every 4 (four) hours as needed for severe pain. 07/17/16   Enid Derry, PA-C    Allergies Red dye and Ibuprofen  No family history on file.  Social History Social History   Tobacco Use  . Smoking status: Current  Every Day Smoker    Packs/day: 0.50    Types: Cigarettes  . Smokeless tobacco: Never Used  Substance Use Topics  . Alcohol use: Yes  . Drug use: No    Review of Systems Constitutional: No fever/chills Eyes: No visual changes. ENT: No sore throat. Cardiovascular: Denies chest pain. Respiratory: Denies shortness of breath. Gastrointestinal: No abdominal pain.  No nausea, no vomiting.  No diarrhea.  No constipation. Genitourinary: Negative for dysuria. Musculoskeletal: Negative for back pain. Skin: Negative for rash. Neurological: Negative for headaches, focal weakness or numbness.   ____________________________________________   PHYSICAL EXAM:  VITAL SIGNS: ED Triage Vitals  Enc Vitals Group     BP 12/03/18 1415 129/77     Pulse Rate 12/03/18 1415 84     Resp 12/03/18 1415 16     Temp 12/03/18 1415 98.6 F (37 C)     Temp src --      SpO2 12/03/18 1415 99 %     Weight 12/03/18 1416 160 lb (72.6 kg)     Height 12/03/18 1416 6\' 3"  (1.905 m)     Head Circumference --      Peak Flow --      Pain Score 12/03/18 1440 5     Pain Loc --      Pain Edu? --      Excl. in GC? --    Constitutional: Alert and oriented. Well appearing and in no acute distress. Nose: Edematous nasal turbinates.  Mouth/Throat: Mucous membranes are moist.  Oropharynx non-erythematous. Neck: No stridor.   Hematological/Lymphatic/Immunilogical: No cervical lymphadenopathy. Cardiovascular: Normal rate, regular rhythm. Grossly normal heart sounds.  Good peripheral circulation. Respiratory: Normal respiratory effort.  No retractions. Lungs CTAB. Skin:  Skin is warm, dry and intact. No rash noted. Psychiatric: Mood and affect are normal. Speech and behavior are normal.  ____________________________________________   LABS (all labs ordered are listed, but only abnormal results are displayed)  Labs Reviewed  INFLUENZA PANEL BY PCR (TYPE A & B)   ____________________________________________  EKG    ____________________________________________  RADIOLOGY  ED MD interpretation:    Official radiology report(s): No results found.  ____________________________________________   PROCEDURES  Procedure(s) performed (including Critical Care):  Procedures   ____________________________________________   INITIAL IMPRESSION / ASSESSMENT AND PLAN / ED COURSE  As part of my medical decision making, I reviewed the following data within the [place] was evaluated in Emergency Department on 12/03/2018 for the symptoms described in the history of present illness. He was evaluated in the context of the global COVID-19 pandemic, which necessitated consideration that the patient might be at risk for infection with the SARS-CoV-2 virus that causes COVID-19. Institutional protocols and algorithms that pertain to the evaluation of patients at risk for COVID-19 are in a state of rapid change based on information released by regulatory bodies including the CDC and federal and state organizations. These policies and algorithms were followed during the patient's care in the ED.       Patient present with continual body pain, nausea, and diarrhea. Patient's was negative for Covid-19 a few days ago. Patient rapid flu was negative.  Patient given discharge care instruction and advise to contiune previous medication. ____________________________________________   FINAL CLINICAL IMPRESSION(S) / ED DIAGNOSES  Final diagnoses:  Influenza-like illness     ED Discharge Orders    None       Note:  This document was prepared using Dragon voice recognition software and may include unintentional dictation errors.    Sable Feil, PA-C 12/03/18 1637    Vanessa Texanna, MD 12/04/18 2312

## 2018-12-03 NOTE — ED Triage Notes (Signed)
Pt in via POV, reports being seen recently and swabbed for Covid, advised if he did not get any better to come back to be swabbed for the Flu.  Pt reports ongoing symptoms; body aches, nausea, diarrhea.  Vitals WDL, NAD noted at this time.

## 2020-07-27 IMAGING — CR DG CHEST 2V
1 series · 2 of 2 positions shown · non-contrast
Comparison: None.

CLINICAL DATA: Cough and shortness of breath

EXAM:
CHEST - 2 VIEW

[Series 1: dg chest 2 view · 0.14mm/px · 2 of 2 slices shown]
[im 1/2]
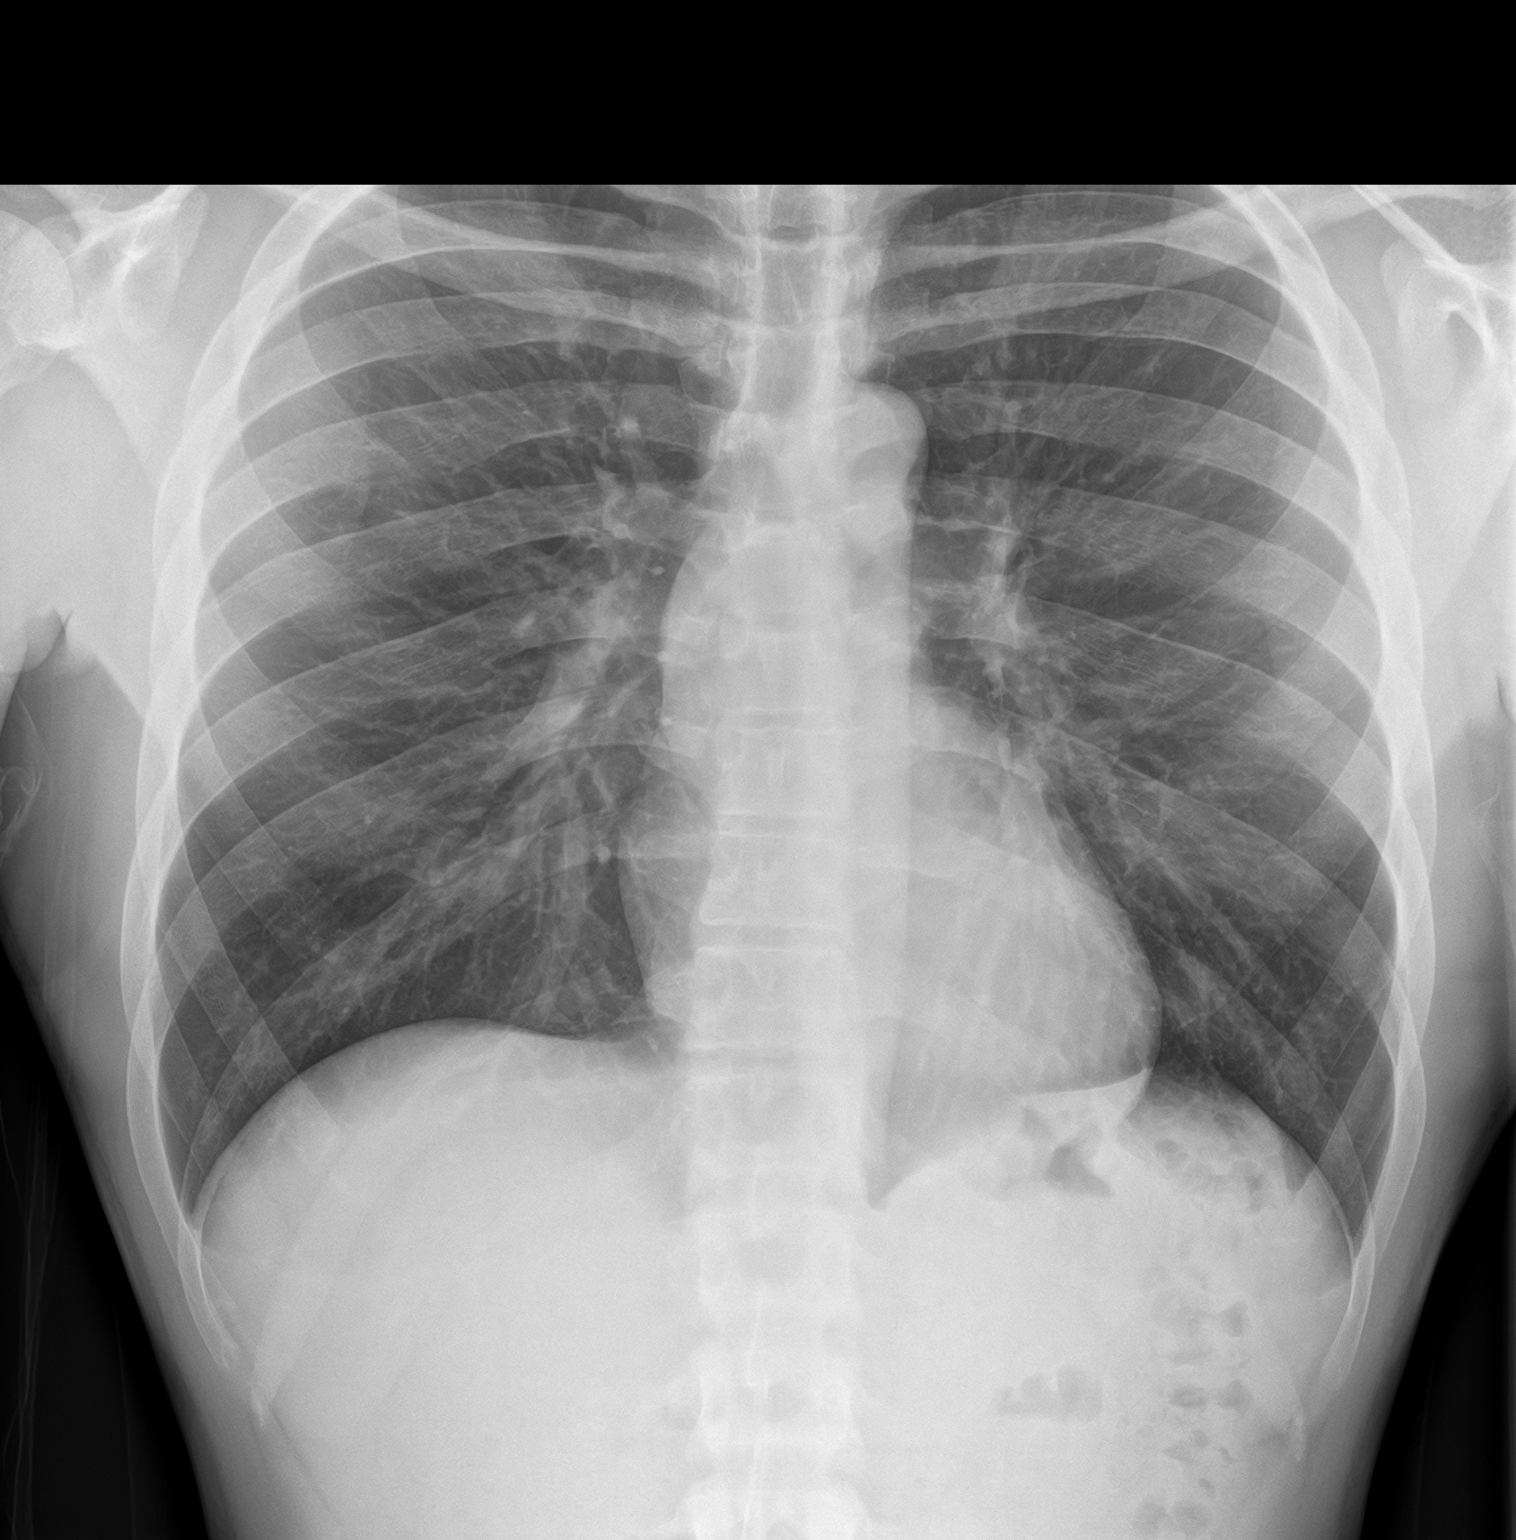
[im 2/2]
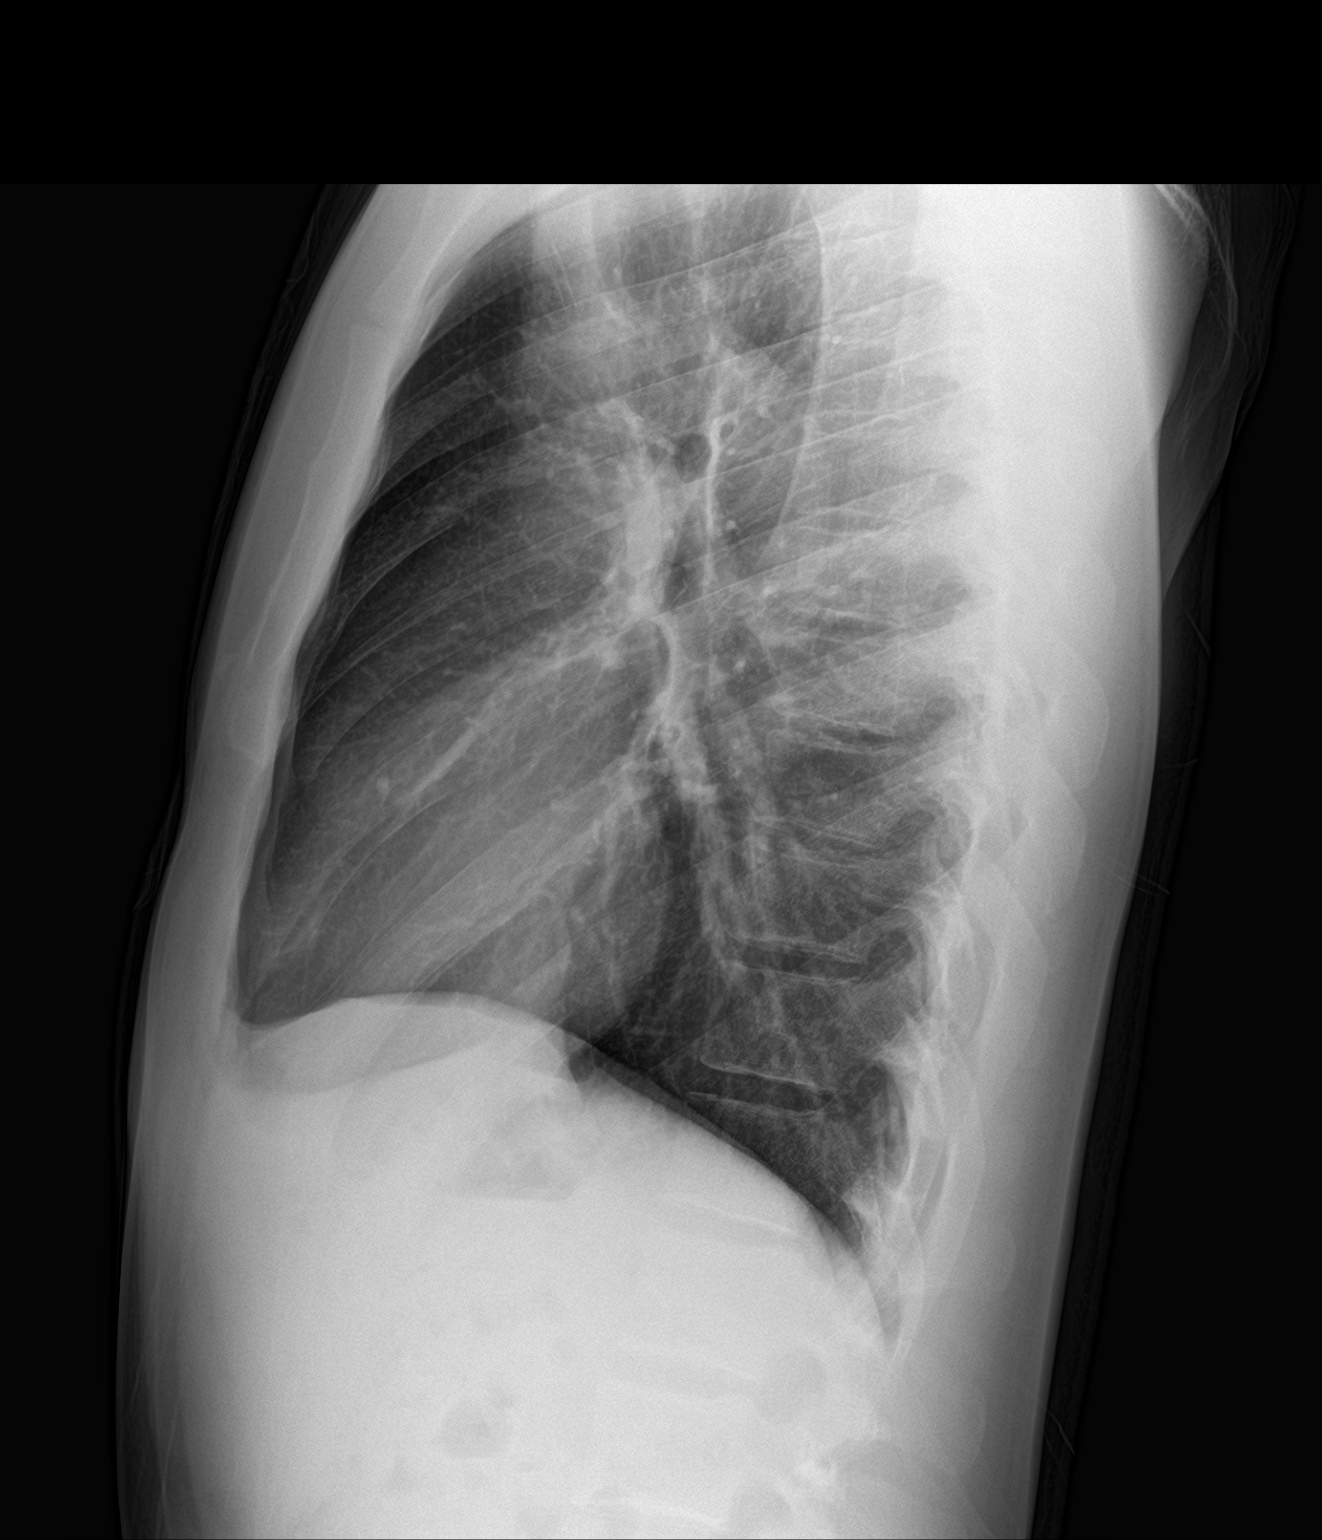

[2 of 2 positions shown; findings below may reference images not displayed]

FINDINGS: Lungs are clear. Heart size and pulmonary vascularity are normal. No
adenopathy. No bone lesions.
IMPRESSION: No edema or consolidation.

## 2020-12-06 IMAGING — CR LEFT HAND - COMPLETE 3+ VIEW
1 series · 3 of 3 positions shown · non-contrast
Comparison: None.

CLINICAL DATA: Left index finger pain, injury

EXAM:
LEFT HAND - COMPLETE 3+ VIEW

[Series 1: dg hand complete left · 0.14mm/px · 3 of 3 slices shown]
[im 1/3]
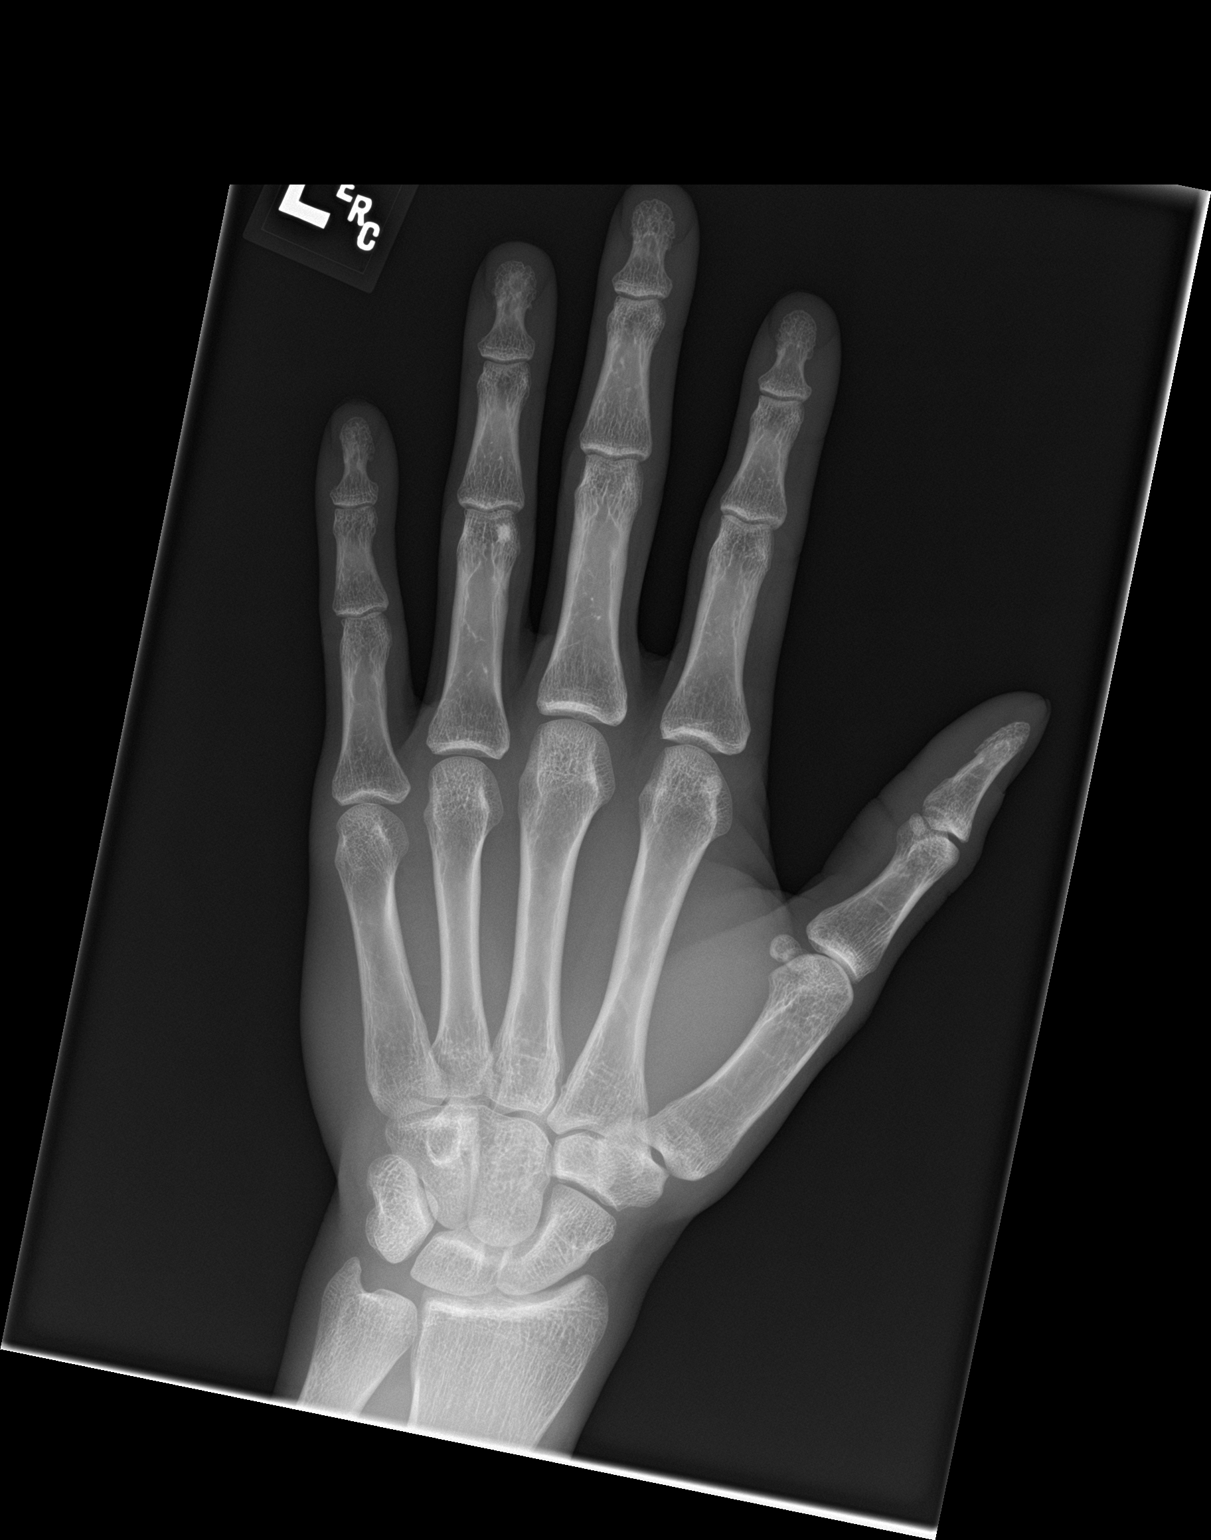
[im 2/3]
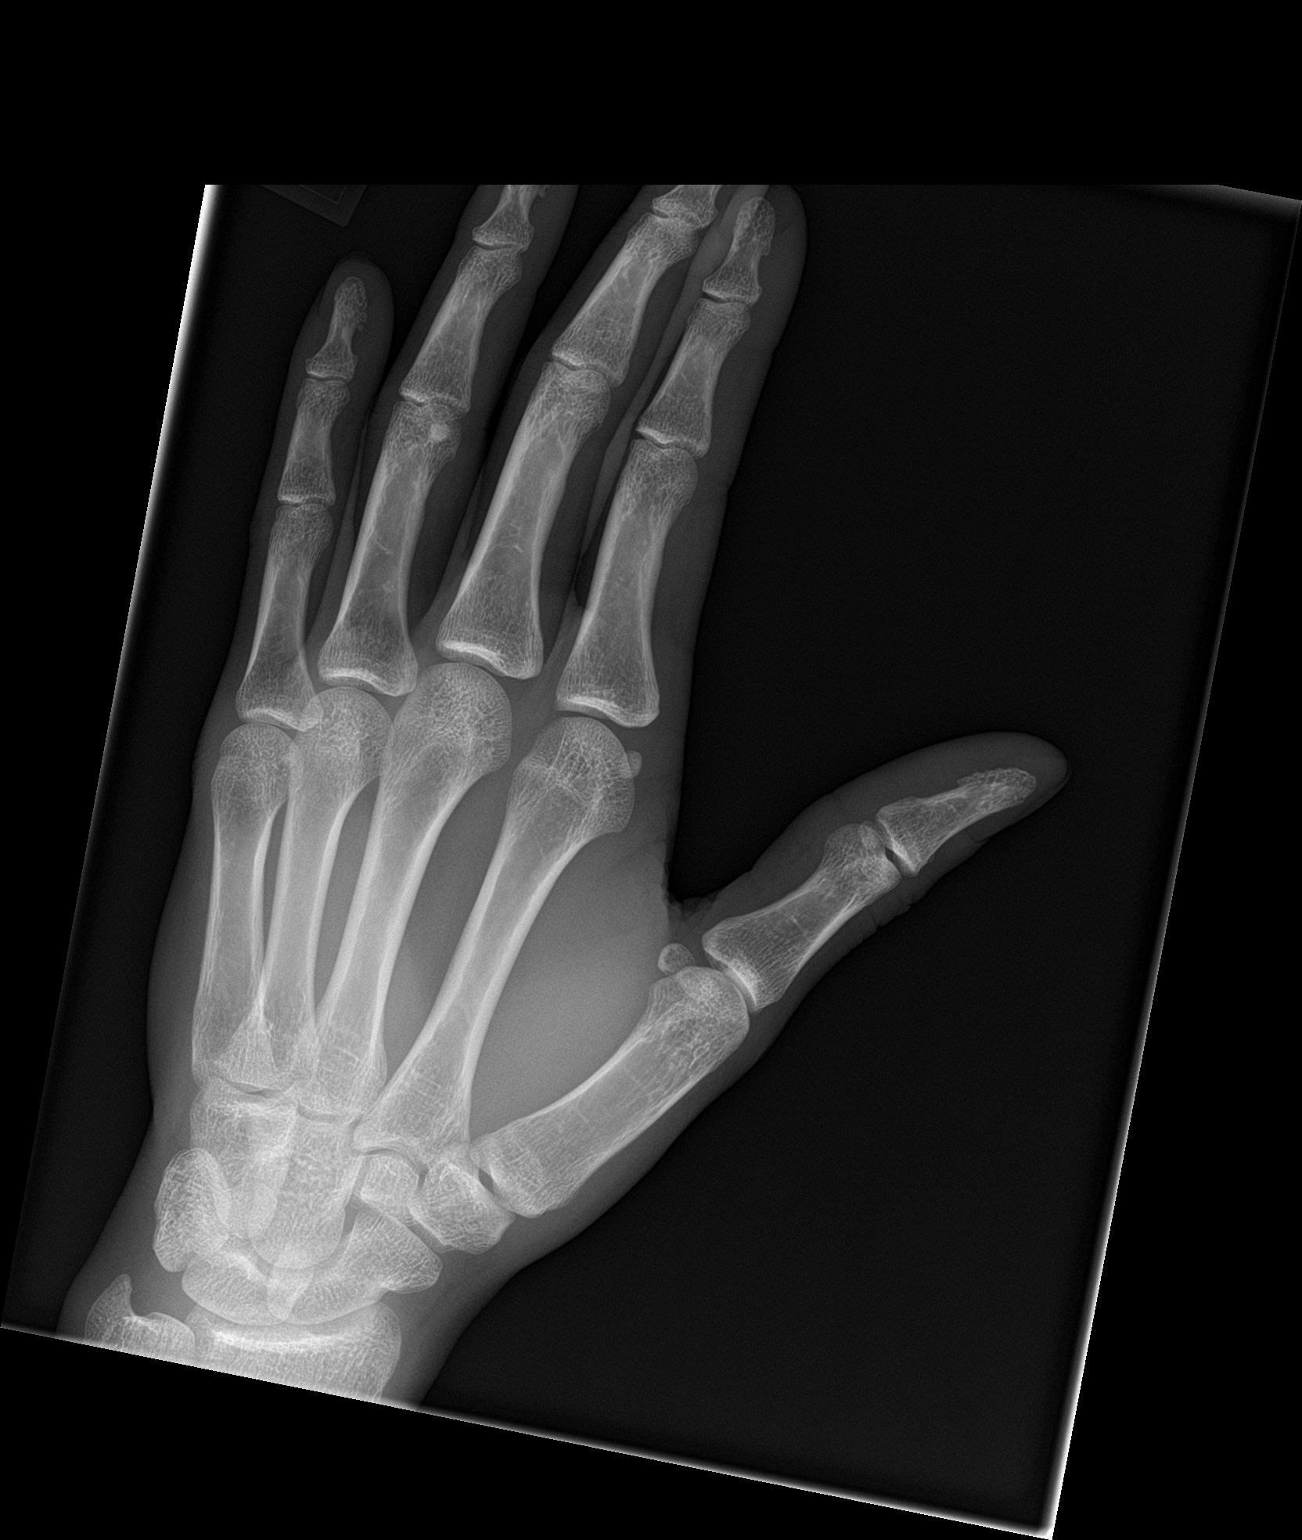
[im 3/3]
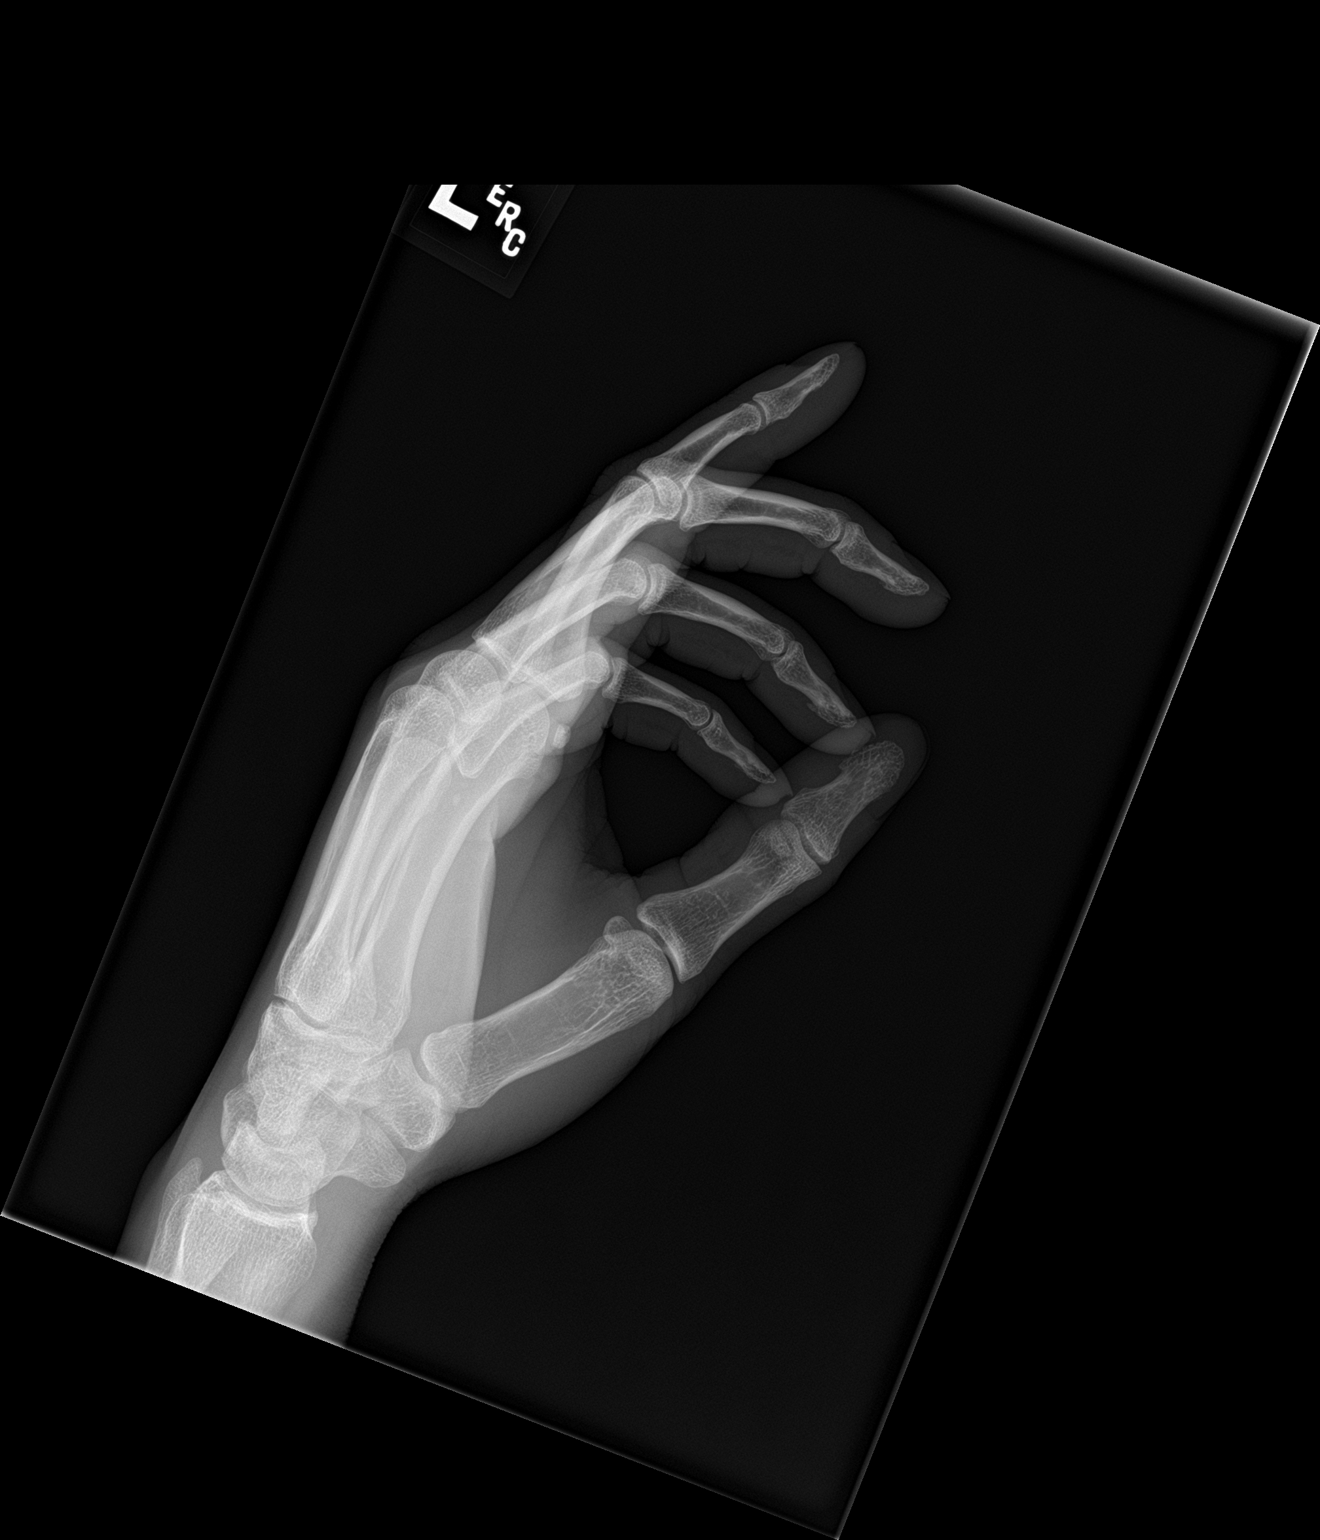

[3 of 3 positions shown; findings below may reference images not displayed]

FINDINGS: There is no evidence of fracture or dislocation. There is no
evidence of arthropathy or other focal bone abnormality. Soft
tissues are unremarkable.
IMPRESSION: Negative.
